# Patient Record
Sex: Female | Born: 1995 | Marital: Single | Smoking: Never smoker
Health system: Southern US, Community
[De-identification: ages and names within clinical notes are randomized; demographics above are authoritative.]

---

## 2020-04-10 ENCOUNTER — Encounter (HOSPITAL_COMMUNITY): Payer: Self-pay

## 2020-04-10 ENCOUNTER — Emergency Department (HOSPITAL_COMMUNITY): Payer: Self-pay

## 2020-04-10 ENCOUNTER — Emergency Department (HOSPITAL_COMMUNITY)
Admission: EM | Admit: 2020-04-10 | Discharge: 2020-04-10 | Disposition: A | Discharge status: Home / Self Care | Payer: Self-pay | Attending: Emergency Medicine | Admitting: Emergency Medicine

## 2020-04-10 ENCOUNTER — Other Ambulatory Visit: Payer: Self-pay

## 2020-04-10 DIAGNOSIS — R1010 Upper abdominal pain, unspecified: Secondary | ICD-10-CM | POA: Insufficient documentation

## 2020-04-10 LAB — COMPREHENSIVE METABOLIC PANEL
ALT: 168 U/L — ABNORMAL HIGH (ref 0–44)
AST: 98 U/L — ABNORMAL HIGH (ref 15–41)
Albumin: 4.4 g/dL (ref 3.5–5.0)
Alkaline Phosphatase: 166 U/L — ABNORMAL HIGH (ref 38–126)
Anion gap: 11 (ref 5–15)
BUN: 9 mg/dL (ref 6–20)
CO2: 24 mmol/L (ref 22–32)
Calcium: 9.5 mg/dL (ref 8.9–10.3)
Chloride: 103 mmol/L (ref 98–111)
Creatinine, Ser: 0.7 mg/dL (ref 0.44–1.00)
GFR calc Af Amer: >60 mL/min (ref >60)
GFR calc non Af Amer: >60 mL/min (ref >60)
Glucose, Bld: 125 mg/dL — ABNORMAL HIGH (ref 70–99)
Potassium: 3.7 mmol/L (ref 3.5–5.1)
Sodium: 138 mmol/L (ref 135–145)
Total Bilirubin: 1.9 mg/dL — ABNORMAL HIGH (ref 0.3–1.2)
Total Protein: 7.7 g/dL (ref 6.5–8.1)

## 2020-04-10 LAB — URINALYSIS, ROUTINE W REFLEX MICROSCOPIC
Bilirubin Urine: NEGATIVE
Glucose, UA: NEGATIVE mg/dL
Hgb urine dipstick: NEGATIVE
Ketones, ur: 20 mg/dL — AB
Nitrite: NEGATIVE
Protein, ur: 30 mg/dL — AB
Specific Gravity, Urine: 1.019 (ref 1.005–1.030)
pH: 7 (ref 5.0–8.0)

## 2020-04-10 LAB — CBC
HCT: 45.3 % (ref 36.0–46.0)
Hemoglobin: 14.7 g/dL (ref 12.0–15.0)
MCH: 30.3 pg (ref 26.0–34.0)
MCHC: 32.5 g/dL (ref 30.0–36.0)
MCV: 93.4 fL (ref 80.0–100.0)
Platelets: 325 10*3/uL (ref 150–400)
RBC: 4.85 MIL/uL (ref 3.87–5.11)
RDW: 12.1 % (ref 11.5–15.5)
WBC: 14.5 10*3/uL — ABNORMAL HIGH (ref 4.0–10.5)
nRBC: 0 % (ref 0.0–0.2)

## 2020-04-10 LAB — HCG, QUANTITATIVE, PREGNANCY: hCG, Beta Chain, Quant, S: <1 m[IU]/mL (ref <5)

## 2020-04-10 LAB — POC URINE PREG, ED: Preg Test, Ur: NEGATIVE

## 2020-04-10 LAB — LIPASE, BLOOD: Lipase: 28 U/L (ref 11–51)

## 2020-04-10 MED ORDER — HYDROCODONE-ACETAMINOPHEN 5-325 MG PO TABS: 1.0000 | ORAL_TABLET | Freq: Four times a day (QID) | ORAL | 0 refills | Status: DC | PRN

## 2020-04-10 MED ORDER — SODIUM CHLORIDE 0.9% FLUSH
3.0000 mL | Freq: Once | INTRAVENOUS | Status: AC
  Administered 2020-04-10: 3 mL via INTRAVENOUS

## 2020-04-10 MED ORDER — SODIUM CHLORIDE 0.9 % IV BOLUS
1000.0000 mL | Freq: Once | INTRAVENOUS | Status: AC
  Administered 2020-04-10: 1000 mL via INTRAVENOUS

## 2020-04-10 MED ORDER — CIPROFLOXACIN HCL 250 MG PO TABS
500.0000 mg | ORAL_TABLET | Freq: Once | ORAL | Status: AC
  Administered 2020-04-10: 500 mg via ORAL
  Filled 2020-04-10: qty 2

## 2020-04-10 MED ORDER — ONDANSETRON 4 MG PO TBDP: ORAL_TABLET | ORAL | 0 refills | Status: DC

## 2020-04-10 MED ORDER — ONDANSETRON HCL 4 MG/2ML IJ SOLN
4.0000 mg | Freq: Once | INTRAMUSCULAR | Status: AC
  Administered 2020-04-10: 4 mg via INTRAVENOUS
  Filled 2020-04-10: qty 2

## 2020-04-10 MED ORDER — HYDROMORPHONE HCL 1 MG/ML IJ SOLN
1.0000 mg | Freq: Once | INTRAMUSCULAR | Status: AC
  Administered 2020-04-10: 1 mg via INTRAVENOUS
  Filled 2020-04-10: qty 1

## 2020-04-10 MED ORDER — IOHEXOL 300 MG/ML  SOLN
100.0000 mL | Freq: Once | INTRAMUSCULAR | Status: AC | PRN
  Administered 2020-04-10: 100 mL via INTRAVENOUS

## 2020-04-10 MED ORDER — CIPROFLOXACIN HCL 500 MG PO TABS
500.0000 mg | ORAL_TABLET | Freq: Two times a day (BID) | ORAL | 0 refills | Status: DC
Start: 2020-04-10 — End: 2020-05-30

## 2020-04-10 MED ORDER — IOHEXOL 300 MG/ML  SOLN: 75.0000 mL | Freq: Once | INTRAMUSCULAR | Status: DC | PRN

## 2020-04-10 NOTE — ED Provider Notes (Signed)
[facility] EMERGENCY DEPARTMENT Provider Note   CSN: [id number] Arrival date & time: 04/10/20  1546     History Chief Complaint  Patient presents with  . Abdominal Pain    Laura Reese is a 24 y.o. female.  Patient complains of abdominal pain  The history is provided by the patient. No language interpreter was used.  Abdominal Pain Pain location:  Epigastric Pain quality: aching   Pain radiates to:  Does not radiate Pain severity:  Moderate Onset quality:  Sudden Timing:  Constant Chronicity:  New Context: not alcohol use   Associated symptoms: no chest pain, no cough, no diarrhea, no fatigue and no hematuria        History reviewed. No pertinent past medical history.  There are no problems to display for this patient.   History reviewed. No pertinent surgical history.   OB History   No obstetric history on file.     No family history on file.  Social History   Tobacco Use  . Smoking status: Never Smoker  . Smokeless tobacco: Never Used  Substance Use Topics  . Alcohol use: Never  . Drug use: Never    Home Medications Prior to Admission medications   Medication Sig Start Date End Date Taking? Authorizing Provider  acetaminophen (TYLENOL) 500 MG tablet Take 500 mg by mouth daily as needed for mild pain or moderate pain.   Yes [provider]  ciprofloxacin (CIPRO) 500 MG tablet Take 1 tablet (500 mg total) by mouth 2 (two) times daily. One po bid x 7 days 04/10/20   Bethann Berkshire, MD  HYDROcodone-acetaminophen (NORCO/VICODIN) 5-325 MG tablet Take 1 tablet by mouth every 6 (six) hours as needed. 04/10/20   Bethann Berkshire, MD  ondansetron (ZOFRAN ODT) 4 MG disintegrating tablet 4mg  ODT q4 hours prn nausea/vomit 04/10/20   04/12/20, MD    Allergies    Patient has no known allergies.  Review of Systems   Review of Systems  Constitutional: Negative for appetite change and fatigue.  HENT: Negative for congestion, ear discharge and sinus  pressure.   Eyes: Negative for discharge.  Respiratory: Negative for cough.   Cardiovascular: Negative for chest pain.  Gastrointestinal: Positive for abdominal pain. Negative for diarrhea.  Genitourinary: Negative for frequency and hematuria.  Musculoskeletal: Negative for back pain.  Skin: Negative for rash.  Neurological: Negative for seizures and headaches.  Psychiatric/Behavioral: Negative for hallucinations.    Physical Exam Updated Vital Signs BP 116/66   Pulse 85   Temp 98.7 F (37.1 C) (Oral)   Resp 18   Ht 5\' 3"  (1.6 m)   Wt 77.1 kg   LMP 03/24/2020   SpO2 96%   BMI 30.11 kg/m   Physical Exam Vitals and nursing note reviewed.  Constitutional:      Appearance: Normal appearance. She is well-developed.  HENT:     Head: Normocephalic.     Nose: Nose normal.  Eyes:     General: No scleral icterus.    Conjunctiva/sclera: Conjunctivae normal.  Neck:     Thyroid: No thyromegaly.  Cardiovascular:     Rate and Rhythm: Normal rate and regular rhythm.     Heart sounds: No murmur. No friction rub. No gallop.   Pulmonary:     Breath sounds: No stridor. No wheezing or rales.  Chest:     Chest wall: No tenderness.  Abdominal:     General: There is no distension.     Tenderness: There is abdominal tenderness.  There is no rebound.  Musculoskeletal:        General: Normal range of motion.     Cervical back: Neck supple.  Lymphadenopathy:     Cervical: No cervical adenopathy.  Skin:    Findings: No erythema or rash.  Neurological:     Mental Status: She is alert and oriented to person, place, and time.     Motor: No abnormal muscle tone.     Coordination: Coordination normal.  Psychiatric:        Behavior: Behavior normal.     ED Results / Procedures / Treatments   Labs (all labs ordered are listed, but only abnormal results are displayed) Labs Reviewed  COMPREHENSIVE METABOLIC PANEL - Abnormal; Notable for the following components:      Result Value    Glucose, Bld 125 (*)    AST 98 (*)    ALT 168 (*)    Alkaline Phosphatase 166 (*)    Total Bilirubin 1.9 (*)    All other components within normal limits  CBC - Abnormal; Notable for the following components:   WBC 14.5 (*)    All other components within normal limits  URINALYSIS, ROUTINE W REFLEX MICROSCOPIC - Abnormal; Notable for the following components:   APPearance HAZY (*)    Ketones, ur 20 (*)    Protein, ur 30 (*)    Leukocytes,Ua MODERATE (*)    Bacteria, UA RARE (*)    All other components within normal limits  URINE CULTURE  LIPASE, BLOOD  HCG, QUANTITATIVE, PREGNANCY  POC URINE PREG, ED    EKG None  Radiology CT ABDOMEN PELVIS W CONTRAST  Result Date: 04/10/2020 CLINICAL DATA:  Neutropenia upper abdominal pain with vomiting and diarrhea EXAM: CT ABDOMEN AND PELVIS WITH CONTRAST TECHNIQUE: Multidetector CT imaging of the abdomen and pelvis was performed using the standard protocol following bolus administration of intravenous contrast. CONTRAST:  OMNIPAQUE IOHEXOL 300 MG/ML  SOLN COMPARISON:  None. FINDINGS: Lower chest: No acute abnormality. Hepatobiliary: No calcified stone. Possible mild wall thickening. No inflammatory changes. No focal hepatic abnormality. No biliary dilatation. Pancreas: Unremarkable. No pancreatic ductal dilatation or surrounding inflammatory changes. Spleen: Normal in size without focal abnormality. Adrenals/Urinary Tract: Adrenal glands are unremarkable. Kidneys are normal, without renal calculi, focal lesion, or hydronephrosis. Bladder is unremarkable. Stomach/Bowel: Stomach is within normal limits. Appendix appears normal. No evidence of bowel wall thickening, distention, or inflammatory changes. Vascular/Lymphatic: No significant vascular findings are present. No enlarged abdominal or pelvic lymph nodes. Reproductive: Uterus and bilateral adnexa are unremarkable. Other: No abdominal wall hernia or abnormality. No abdominopelvic ascites.  Musculoskeletal: No acute or significant osseous findings. IMPRESSION: 1. Possible mild wall thickening of the gallbladder but calcified stone or other inflammatory changes. Correlation with [place] may be obtained as indicated. 2. Otherwise negative CT examination of the abdomen and pelvis. Electronically Signed   By: Jasmine Pang M.D.   On: 04/10/2020 20:09    Procedures Procedures (including critical care time)  Medications Ordered in ED Medications  ciprofloxacin (CIPRO) tablet 500 mg (has no administration in time range)  sodium chloride flush (NS) 0.9 % injection 3 mL (3 mLs Intravenous Given 04/10/20 1718)  sodium chloride 0.9 % bolus 1,000 mL (0 mLs Intravenous Stopped 04/10/20 1836)  HYDROmorphone (DILAUDID) injection 1 mg (1 mg Intravenous Given 04/10/20 1718)  ondansetron (ZOFRAN) injection 4 mg (4 mg Intravenous Given 04/10/20 1718)  iohexol (OMNIPAQUE) 300 MG/ML solution 100 mL (100 mLs Intravenous Contrast Given 04/10/20 1952)  ED Course  I have reviewed the triage vital signs and the nursing notes.  Pertinent labs & imaging results that were available during my care of the patient were reviewed by me and considered in my medical decision making (see chart for details). CRITICAL CARE Performed by: Milton Ferguson Total critical care time: 40 minutes Critical care time was exclusive of separately billable procedures and treating other patients. Critical care was necessary to treat or prevent imminent or life-threatening deterioration. Critical care was time spent personally by me on the following activities: development of treatment plan with patient and/or surrogate as well as nursing, discussions with consultants, evaluation of patient's response to treatment, examination of patient, obtaining history from patient or surrogate, ordering and performing treatments and interventions, ordering and review of laboratory studies, ordering and review of radiographic studies, pulse oximetry and  re-evaluation of patient's condition.    MDM Rules/Calculators/A&P                      Patient with abdominal pain.  CT scan suggests inflamed gallbladder.  I spoke with general surgery Dr. Arnoldo Morale and he stated the patient can be admitted today or follow-up in his office tomorrow morning with an ultrasound.  Patient opted to go home and she was given Cipro Vicodin and Zofran and she will return in the morning for an ultrasound and see Dr. Arnoldo Morale       This patient presents to the ED for concern of abdominal pain this involves an extensive number of treatment options, and is a complaint that carries with it a high risk of complications and morbidity.  The differential diagnosis includes cholecystitis gastritis appendicitis   Lab Tests:   I Ordered, reviewed, and interpreted labs, which included CBC and chemistries and liver studies which showed elevated white count elevated liver studies  Medicines ordered:   I ordered medication Dilaudid for pain Cipro for infection  Imaging Studies ordered:   I ordered imaging studies which included CT scan abdomen and  I independently visualized and interpreted imaging which showed gallstones  Additional history obtained:   Additional history obtained from mother  Previous records obtained and reviewed  Consultations Obtained:   I consulted general surgery and discussed lab and imaging findings  Reevaluation:  After the interventions stated above, I reevaluated the patient and found some improved  Critical Interventions:  .   Final Clinical Impression(s) / ED Diagnoses Final diagnoses:  None    Rx / DC Orders ED Discharge Orders         Ordered    US Abdomen Complete    Comments: Needs to be done first thing in the morning on 5/27.  Dr. Arnoldo Morale wants her In his  Office by 11am   04/10/20 2102    ciprofloxacin (CIPRO) 500 MG tablet  2 times daily     04/10/20 2109    ondansetron (ZOFRAN ODT) 4 MG disintegrating  tablet     04/10/20 2109    HYDROcodone-acetaminophen (NORCO/VICODIN) 5-325 MG tablet  Every 6 hours PRN     04/10/20 2109           Milton Ferguson, MD 04/10/20 2129

## 2020-04-10 NOTE — Discharge Instructions (Signed)
Call the x-ray department at 7 AM tomorrow morning so you can set up a time to come in to get your ultrasound.  Tell them that Dr. Lovell Sheehan wants you to get the ultrasound tomorrow morning and then he wants you to see him by 11 AM at his office.  Phone number for the x-ray department is [phone]

## 2020-04-10 NOTE — ED Triage Notes (Signed)
Pt presents to ED with complaints of upper right abdominal pain, vomiting x 2 on Sunday, and diarrhea since Sunday.

## 2020-04-10 NOTE — ED Notes (Signed)
Pt. With Ct 

## 2020-04-12 LAB — URINE CULTURE

## 2020-04-29 ENCOUNTER — Other Ambulatory Visit: Payer: Self-pay

## 2020-04-29 ENCOUNTER — Ambulatory Visit (HOSPITAL_COMMUNITY)
Admission: RE | Admit: 2020-04-29 | Discharge: 2020-04-29 | Disposition: A | Discharge status: Home / Self Care | Payer: Self-pay | Source: Ambulatory Visit | Attending: Emergency Medicine | Admitting: Emergency Medicine

## 2020-04-29 DIAGNOSIS — K760 Fatty (change of) liver, not elsewhere classified: Secondary | ICD-10-CM | POA: Insufficient documentation

## 2020-05-07 ENCOUNTER — Ambulatory Visit: Payer: Self-pay | Admitting: General Surgery

## 2020-05-30 ENCOUNTER — Ambulatory Visit (INDEPENDENT_AMBULATORY_CARE_PROVIDER_SITE_OTHER): Payer: 59 | Admitting: General Surgery

## 2020-05-30 ENCOUNTER — Encounter: Payer: Self-pay | Admitting: General Surgery

## 2020-05-30 ENCOUNTER — Other Ambulatory Visit: Payer: Self-pay

## 2020-05-30 VITALS — BP 123/84 | HR 93 | Temp 98.9°F | Resp 16 | Ht 63.5 in | Wt 177.2 lb

## 2020-05-30 DIAGNOSIS — K838 Other specified diseases of biliary tract: Secondary | ICD-10-CM

## 2020-05-30 NOTE — Progress Notes (Signed)
Laura Reese; [id number]; 03-31-1996   HPI Patient is a 24 year old white female who was referred to my care by the emergency room for evaluation treatment of biliary colic secondary to sludge.  Patient was initially seen in the emergency room in May of this year.  She was seen on CT scan of the abdomen to have inflammation of the gallbladder.  Her liver enzyme tests were elevated.  This episode occurred soon after eating.  She states this was her first episode.  Since that time, she has not had any episodes of right upper quadrant abdominal pain, nausea, or vomiting.  She has tried to watch what she eats and to avoid any trigger foods.  She denies any fever, chills, or jaundice.  She did have a follow-up ultrasound which revealed either biliary sludge or a very small gallbladder polyp.  She currently has 0 out of 10 abdominal pain. History reviewed. No pertinent past medical history.  History reviewed. No pertinent surgical history.  History reviewed. No pertinent family history.  No current outpatient medications on file prior to visit.   No current facility-administered medications on file prior to visit.    No Known Allergies  Social History   Substance and Sexual Activity  Alcohol Use Never    Social History   Tobacco Use  Smoking Status Never Smoker  Smokeless Tobacco Never Used    Review of Systems  Constitutional: Negative.   HENT: Negative.   Eyes: Negative.   Respiratory: Negative.   Cardiovascular: Negative.   Gastrointestinal: Negative.   Genitourinary: Negative.   Musculoskeletal: Negative.   Skin: Negative.   Neurological: Negative.   Endo/Heme/Allergies: Negative.   Psychiatric/Behavioral: Negative.     Objective   Vitals:   05/30/20 1116  BP: 123/84  Pulse: 93  Resp: 16  Temp: 98.9 F (37.2 C)  SpO2: 98%    Physical Exam Vitals reviewed.  Constitutional:      Appearance: Normal appearance. She is not ill-appearing.  HENT:     Head:  Normocephalic and atraumatic.  Eyes:     General: No scleral icterus. Cardiovascular:     Rate and Rhythm: Normal rate and regular rhythm.     Heart sounds: Normal heart sounds. No murmur heard.  No friction rub. No gallop.   Pulmonary:     Effort: Pulmonary effort is normal. No respiratory distress.     Breath sounds: Normal breath sounds. No stridor. No wheezing, rhonchi or rales.  Abdominal:     General: Bowel sounds are normal. There is no distension.     Palpations: Abdomen is soft. There is no mass.     Tenderness: There is no abdominal tenderness. There is no guarding or rebound.     Hernia: No hernia is present.  Skin:    General: Skin is warm and dry.  Neurological:     Mental Status: She is alert and oriented to person, place, and time.   ER notes reviewed  Assessment  Biliary colic, sluggish, resolved Plan   As this was her first episode of biliary colic, I told the patient that she has a 50% chance of having a recurrent episode in the next 2 years.  She is trying to avoid any trigger foods.  She would like to wait on surgery at this time, which is fine with me.  She will return should she become symptomatic.  Literature was given.  Follow-up as needed.

## 2020-05-30 NOTE — Patient Instructions (Signed)
Biliary Colic, Adult  Biliary colic is severe pain caused by a problem with a small organ in the upper right part of your belly (gallbladder). The gallbladder stores a digestive fluid produced in the liver (bile) that helps the body break down fat. Bile and other digestive enzymes are carried from the liver to the small intestine through tube-like structures (bile ducts). The gallbladder and the bile ducts form the biliary tract. Sometimes hard deposits of digestive fluids form in the gallbladder (gallstones) and block the flow of bile from the gallbladder, causing biliary colic. This condition is also called a gallbladder attack. Gallstones can be as small as a grain of sand or as big as a golf ball. There could be just one gallstone in the gallbladder, or there could be many. What are the causes? Biliary colic is usually caused by gallstones. Less often, a tumor could block the flow of bile from the gallbladder and trigger biliary colic. What increases the risk? This condition is more likely to develop in:  Women.  People of Hispanic descent.  People with a family history of gallstones.  People who are obese.  People who suddenly or quickly lose weight.  People who eat a high-calorie, low-fiber diet that is rich in refined carbs (carbohydrates), such as white bread and white rice.  People who have an intestinal disease that affects nutrient absorption, such as Crohn disease.  People who have a metabolic condition, such as metabolic syndrome or diabetes. What are the signs or symptoms? Severe pain in the upper right side of the belly is the main symptom of biliary colic. You may feel this pain below the chest but above the hip. This pain often occurs at night or after eating a very fatty meal. This pain may get worse for up to an hour and last as long as 12 hours. In most cases, the pain fades (subsides) within a couple hours. Other symptoms of this condition include:  Nausea and  vomiting.  Pain under the right shoulder. How is this diagnosed? This condition is diagnosed based on your medical history, your symptoms, and a physical exam. You may have tests, including:  Blood tests to rule out infection or inflammation of the bile ducts, gallbladder, pancreas, or liver.  Imaging studies such as: ? Ultrasound. ? CT scan. ? MRI. In some cases, you may need to have an imaging study done using a small amount of radioactive material (nuclear medicine) to confirm the diagnosis. How is this treated? Treatment for this condition may include medicine to relieve your pain or nausea. If you have gallstones that are causing biliary colic, you may need surgery to remove the gallbladder (cholecystectomy). Gallstones can also be dissolved gradually with medicine. It may take months or years before the gallstones are completely gone. Follow these instructions at home:  Take over-the-counter and prescription medicines only as told by your health care provider.  Drink enough fluid to keep your urine clear or pale yellow.  Follow instructions from your health care provider about eating or drinking restrictions. These may include avoiding: ? Fatty, greasy, and fried foods. ? Any foods that make the pain worse. ? Overeating. ? Having a large meal after not eating for a while.  Keep all follow-up visits as told by your health care provider. This is important. How is this prevented? Steps to prevent this condition include:  Maintaining a healthy body weight.  Getting regular exercise.  Eating a healthy, high-fiber, low-fat diet.  Limiting how much   sugar and refined carbs you eat, such as sweets, white flour, and white rice. Contact a health care provider if:  Your pain lasts more than 5 hours.  You vomit.  You have a fever and chills.  Your pain gets worse. Get help right away if:  Your skin or the whites of your eyes look yellow (jaundice).  Your have tea-colored  urine and light-colored stools.  You are dizzy or you faint. Summary  Biliary colic is severe pain caused by a problem with a small organ in the upper right part of your belly (gallbladder).  Treatments for this condition include medicines that relieves your pain or nausea and medicines that slowly dissolves the gallstones.  If gallstones cause your biliary colic, the treatment is surgery to remove the gallbladder (cholecystectomy). This information is not intended to replace advice given to you by your health care provider. Make sure you discuss any questions you have with your health care provider. Document Revised: 10/15/2017 Document Reviewed: 05/18/2016 Elsevier Patient Education  2020 [employer]  

## 2020-07-15 IMAGING — US US ABDOMEN COMPLETE
1 series · 13 of 25 positions shown · non-contrast
Comparison: CT abdomen and pelvis 04/10/2020

CLINICAL DATA: Upper abdominal pain, vomiting, and diarrhea.
Possible gallbladder wall thickening on CT.

EXAM:
ABDOMEN ULTRASOUND COMPLETE

[Series 1: us abdomen complete · 0.22mm/px · 13 of 106 slices shown]
[im 1/106]
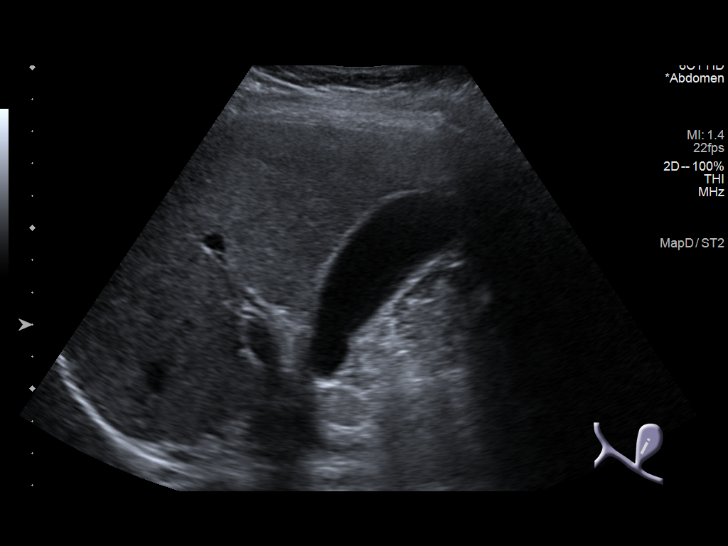
[im 9/106]
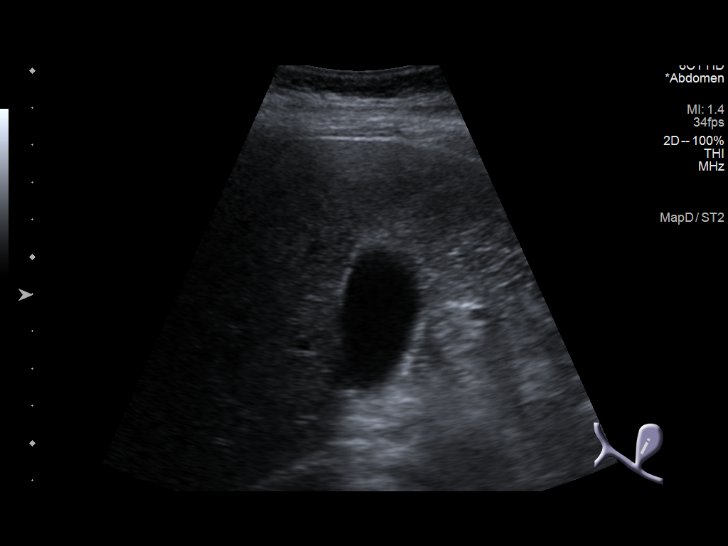
[im 18/106]
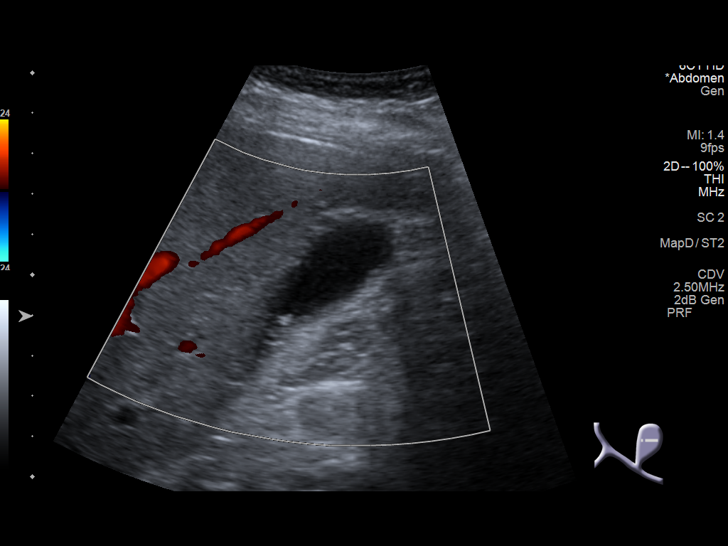
[im 27/106]
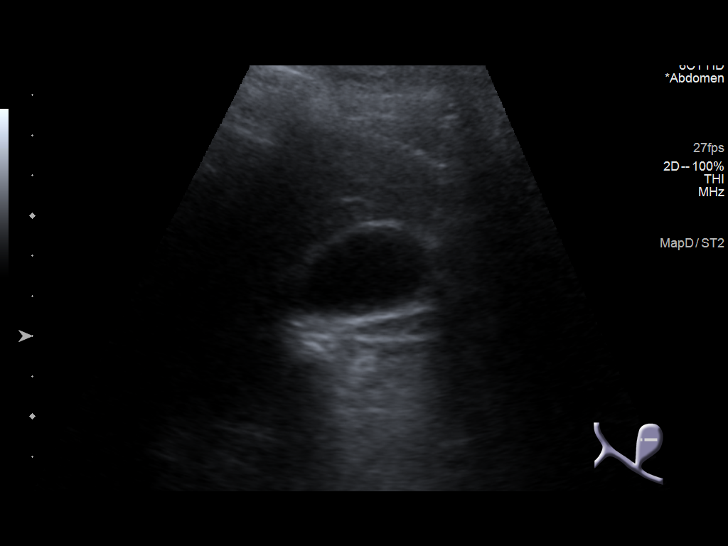
[im 36/106]
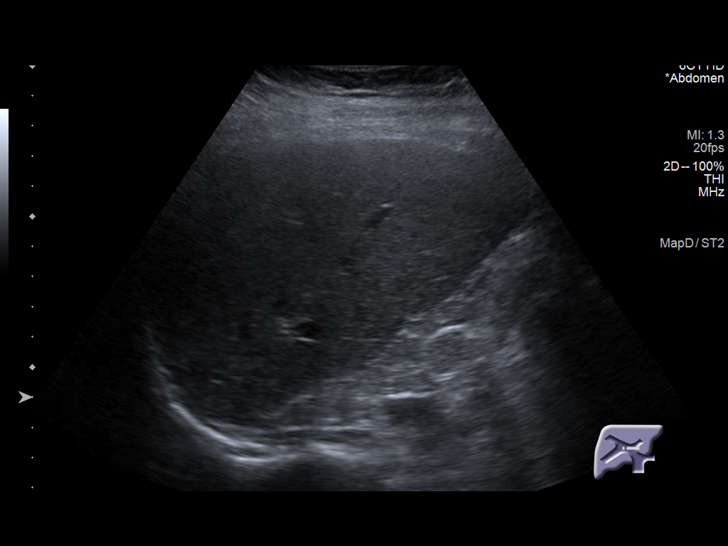
[im 44/106]
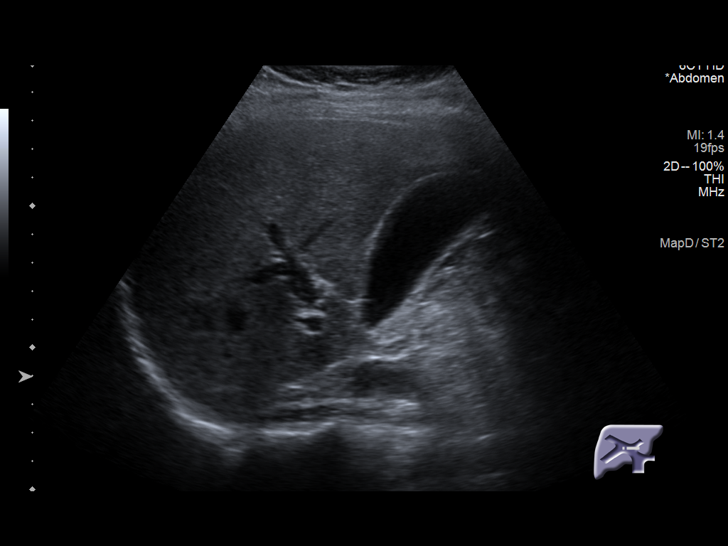
[im 53/106]
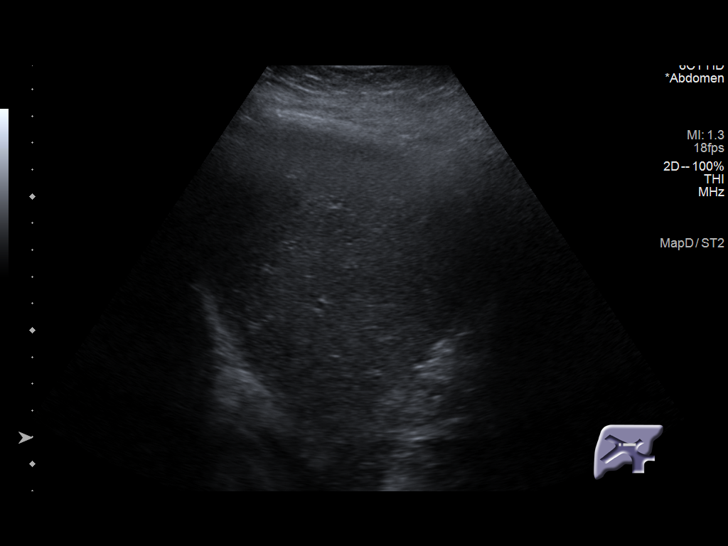
[im 62/106]
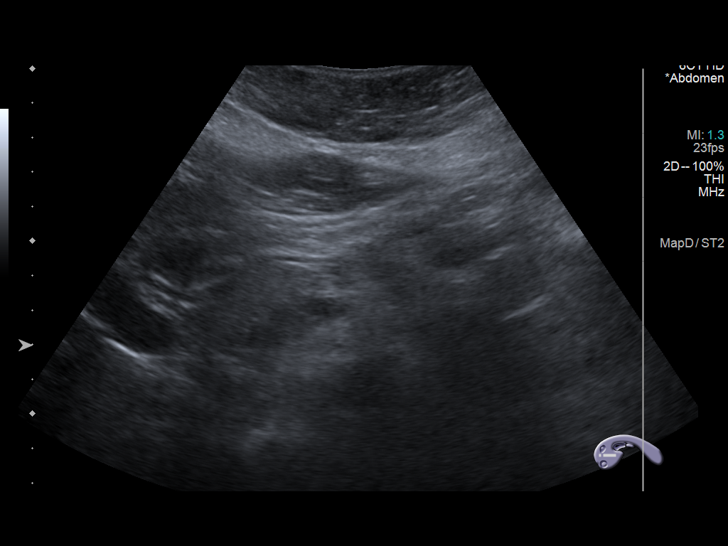
[im 71/106]
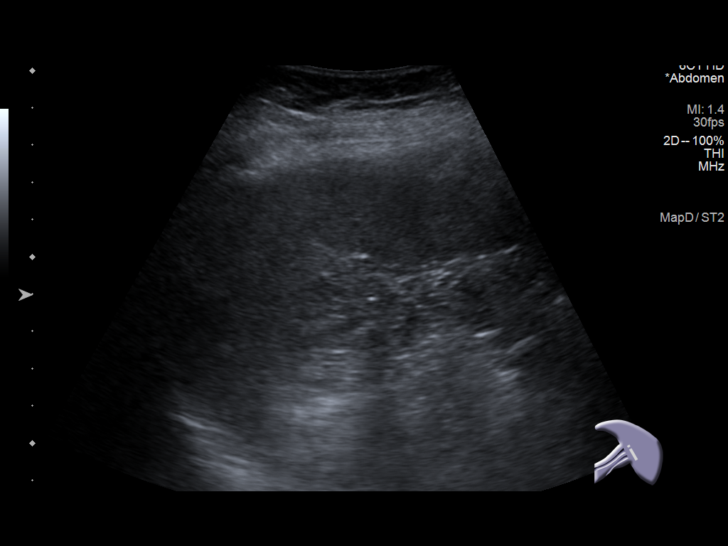
[im 79/106]
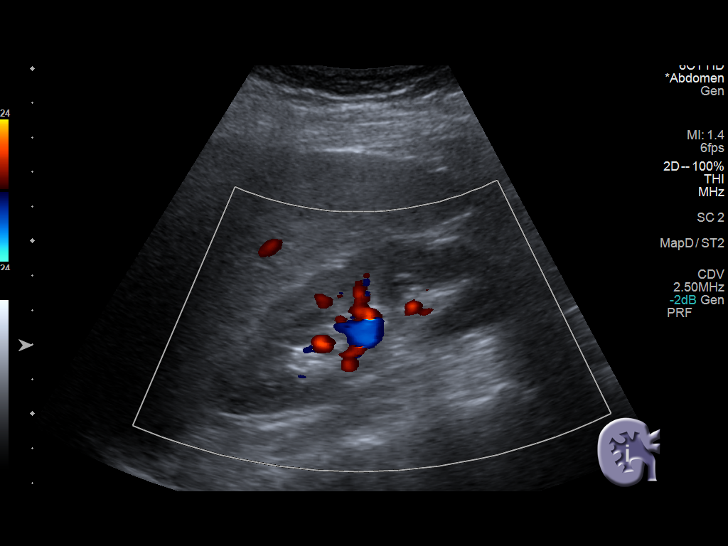
[im 88/106]
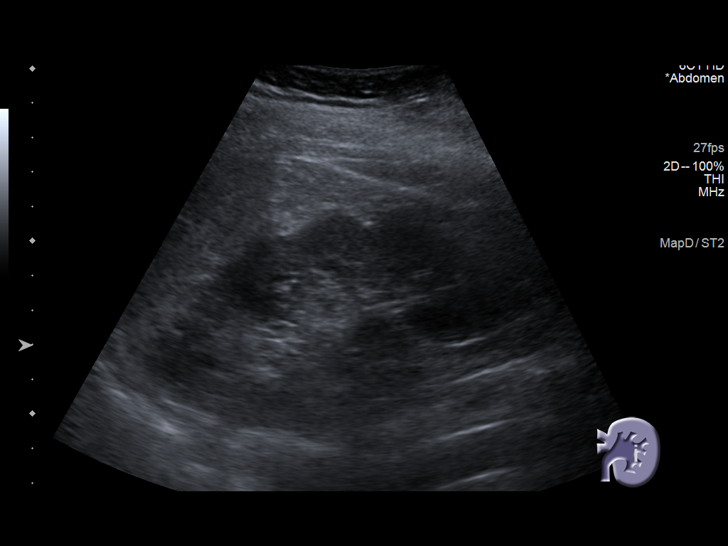
[im 97/106]
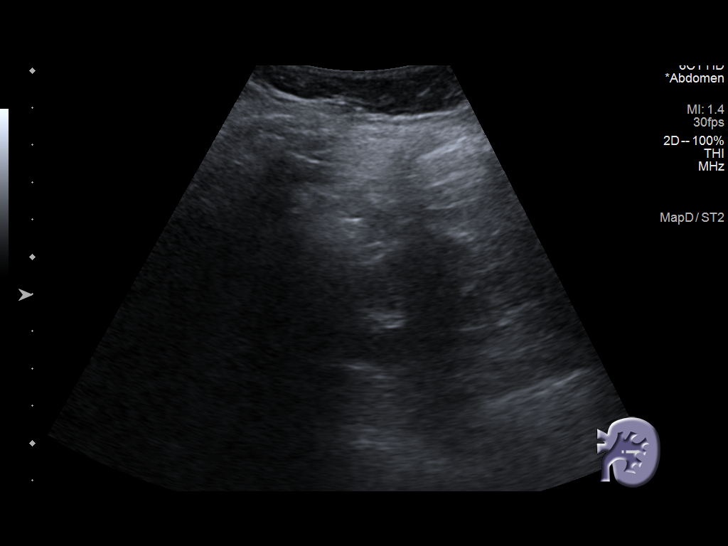
[im 106/106]
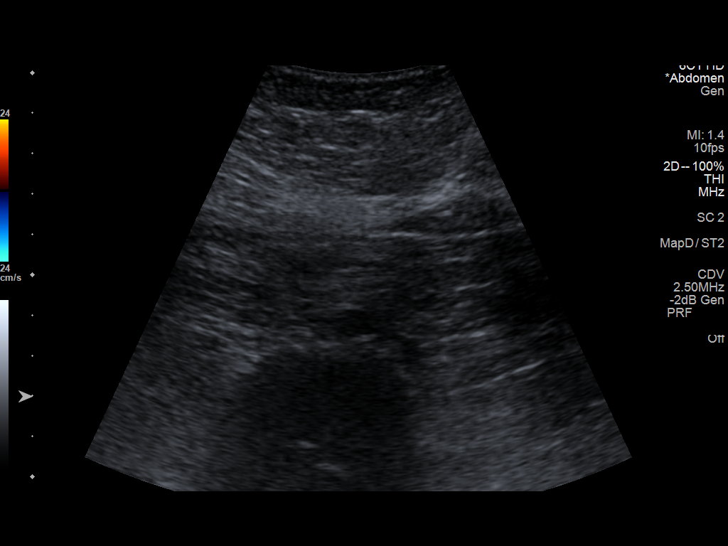

[13 of 25 positions shown; findings below may reference images not displayed]

FINDINGS: Gallbladder: 4 mm rounded nonshadowing echogenic focus in the
gallbladder lumen along the posterior wall. No shadowing gallstones
or wall thickening visualized. No sonographic Murphy sign noted by
sonographer.

Common bile duct: Diameter: 5 mm

Liver: Mildly increased parenchymal echogenicity diffusely without
focal lesion identified. Portal vein is patent on color Doppler
imaging with normal direction of blood flow towards the liver.

IVC: No abnormality visualized.

Pancreas: Visualized portion unremarkable.

Spleen: Size and appearance within normal limits.

Right Kidney: Length: 11.0 cm. Echogenicity within normal limits. No
mass or hydronephrosis visualized.

Left Kidney: Length: 11.4 cm. Echogenicity within normal limits. No
mass or hydronephrosis visualized. Incidental prominent column of
Bertin.

Abdominal aorta: No aneurysm visualized.

Other findings: None.
IMPRESSION: 1. 4 mm polyp versus tiny sludge ball in the gallbladder. No
gallstones or evidence of cholecystitis.
2. Mild hepatic steatosis.

## 2021-04-11 ENCOUNTER — Ambulatory Visit
Admission: RE | Admit: 2021-04-11 | Discharge: 2021-04-11 | Disposition: A | Discharge status: Home / Self Care | Payer: 59 | Source: Ambulatory Visit | Attending: Emergency Medicine | Admitting: Emergency Medicine

## 2021-04-11 ENCOUNTER — Other Ambulatory Visit: Payer: Self-pay

## 2021-04-11 VITALS — BP 118/83 | HR 120 | Temp 99.5°F | Resp 17

## 2021-04-11 DIAGNOSIS — R059 Cough, unspecified: Secondary | ICD-10-CM

## 2021-04-11 DIAGNOSIS — U071 COVID-19: Secondary | ICD-10-CM

## 2021-04-11 DIAGNOSIS — J029 Acute pharyngitis, unspecified: Secondary | ICD-10-CM

## 2021-04-11 MED ORDER — MELOXICAM 7.5 MG PO TABS
7.5000 mg | ORAL_TABLET | Freq: Every day | ORAL | 0 refills | Status: AC
Start: 2021-04-11 — End: ?

## 2021-04-11 MED ORDER — MOLNUPIRAVIR 200 MG PO CAPS: 800.0000 mg | ORAL_CAPSULE | Freq: Two times a day (BID) | ORAL | 0 refills | Status: AC

## 2021-04-11 NOTE — ED Triage Notes (Signed)
Had positive home covid test today.  Having cough, sore throat and fever.

## 2021-04-11 NOTE — Discharge Instructions (Signed)
Home test positive for covid COVID testing ordered.  It will take between 5-7 days for test results.  Someone will contact you regarding abnormal results.    In the meantime: You should remain isolated in your home for 5 days from symptom onset AND greater than 72 hours after symptoms resolution (absence of fever without the use of fever-reducing medication and improvement in respiratory symptoms), whichever is longer Get plenty of rest and push fluids Mobic for pain molnupiravir for covid infection Use OTC zyrtec for nasal congestion, runny nose, and/or sore throat Use OTC flonase for nasal congestion and runny nose Use medications daily for symptom relief Call or go to the ED if you have any new or worsening symptoms such as fever, worsening cough, shortness of breath, chest tightness, chest pain, turning blue, changes in mental status, etc..Marland Kitchen

## 2021-04-11 NOTE — ED Provider Notes (Signed)
[facility]   [id number] 04/11/21 Arrival Time: 1550   CC: COVID symptoms  SUBJECTIVE: History from: patient.  Laura Reese is a 25 y.o. female who presents with cough, sore throat, and fever x 3 days.  Had positive covid test.  Denies alleviating or aggravating factors.  Denies fever, chills, SOB, wheezing, chest pain, nausea, changes in bowel or bladder habits.     ROS: As per HPI.  All other pertinent ROS negative.     History reviewed. No pertinent past medical history. History reviewed. No pertinent surgical history. No Known Allergies No current facility-administered medications on file prior to encounter.   No current outpatient medications on file prior to encounter.   Social History   Socioeconomic History  . Marital status: Single    Spouse name: Not on file  . Number of children: Not on file  . Years of education: Not on file  . Highest education level: Not on file  Occupational History  . Not on file  Tobacco Use  . Smoking status: Never Smoker  . Smokeless tobacco: Never Used  Substance and Sexual Activity  . Alcohol use: Never  . Drug use: Never  . Sexual activity: Not on file  Other Topics Concern  . Not on file  Social History Narrative  . Not on file   Social Determinants of Health   Financial Resource Strain: Not on file  Food Insecurity: Not on file  Transportation Needs: Not on file  Physical Activity: Not on file  Stress: Not on file  Social Connections: Not on file  Intimate Partner Violence: Not on file   No family history on file.  OBJECTIVE:  Vitals:   04/11/21 1651  BP: 118/83  Pulse: (!) 120  Resp: 17  Temp: 99.5 F (37.5 C)  TempSrc: Oral  SpO2: 98%     General appearance: alert; mildly fatigued appearing, nontoxic; speaking in full sentences and tolerating own secretions HEENT: NCAT; Ears: EACs clear, TMs pearly gray; Eyes: PERRL.  EOM grossly intact.Nose: nares patent without rhinorrhea, Throat: oropharynx  clear, tonsils non erythematous or enlarged, uvula midline  Neck: supple without LAD Lungs: unlabored respirations, symmetrical air entry; cough: absent; no respiratory distress; CTAB Heart: regular rate and rhythm.  Skin: warm and dry Psychological: alert and cooperative; normal mood and affect   ASSESSMENT & PLAN:  1. COVID-19 virus infection   2. Sore throat   3. Cough     Meds ordered this encounter  Medications  . meloxicam (MOBIC) 7.5 MG tablet    Sig: Take 1 tablet (7.5 mg total) by mouth daily.    Dispense:  20 tablet    Refill:  0    Order Specific Question:   Supervising Provider    Answer:   Eustace Moore [[id number]]  . Molnupiravir 200 MG CAPS    Sig: Take 4 capsules (800 mg total) by mouth in the morning and at bedtime for 5 days.    Dispense:  40 capsule    Refill:  0    Order Specific Question:   Supervising Provider    Answer:   Eustace Moore [[id number]]   Home test positive for covid COVID testing ordered.  It will take between 5-7 days for test results.  Someone will contact you regarding abnormal results.    In the meantime: You should remain isolated in your home for 5 days from symptom onset AND greater than 72 hours after symptoms resolution (absence of fever without the use  of fever-reducing medication and improvement in respiratory symptoms), whichever is longer Get plenty of rest and push fluids Mobic for pain molnupiravir for covid infection Use OTC zyrtec for nasal congestion, runny nose, and/or sore throat Use OTC flonase for nasal congestion and runny nose Use medications daily for symptom relief Call or go to the ED if you have any new or worsening symptoms such as fever, worsening cough, shortness of breath, chest tightness, chest pain, turning blue, changes in mental status, etc...   Reviewed expectations re: course of current medical issues. Questions answered. Outlined signs and symptoms indicating need for more acute  intervention. Patient verbalized understanding. After Visit Summary given.         Rennis Harding, PA-C 04/11/21 1717
